# Patient Record
Sex: Male | Born: 1995 | Hispanic: Yes | Marital: Single | Smoking: Never smoker
Health system: Southern US, Community
[De-identification: ages and names within clinical notes are randomized; demographics above are authoritative.]

---
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
## 2021-12-17 ENCOUNTER — Encounter (HOSPITAL_COMMUNITY): Payer: Self-pay | Admitting: Emergency Medicine

## 2021-12-17 ENCOUNTER — Emergency Department (HOSPITAL_COMMUNITY)
Admission: EM | Admit: 2021-12-17 | Discharge: 2021-12-18 | Discharge status: Against Medical Advice | Payer: Self-pay | Attending: Physician Assistant | Admitting: Physician Assistant

## 2021-12-17 ENCOUNTER — Other Ambulatory Visit: Payer: Self-pay

## 2021-12-17 DIAGNOSIS — Z5321 Procedure and treatment not carried out due to patient leaving prior to being seen by health care provider: Secondary | ICD-10-CM | POA: Insufficient documentation

## 2021-12-17 DIAGNOSIS — L509 Urticaria, unspecified: Secondary | ICD-10-CM | POA: Insufficient documentation

## 2021-12-17 MED ORDER — DIPHENHYDRAMINE HCL 25 MG PO CAPS
25.0000 mg | ORAL_CAPSULE | Freq: Once | ORAL | Status: AC
  Administered 2021-12-17: 25 mg via ORAL
  Filled 2021-12-17: qty 1

## 2021-12-17 NOTE — ED Triage Notes (Signed)
Patient reports generalized rashes /hives this evening , airway intact/no oral swelling .

## 2021-12-17 NOTE — ED Provider Triage Note (Signed)
Emergency Medicine Provider Triage Evaluation Note  Kenshawn Maciolek , a 26 y.o. male  was evaluated in triage.  Pt complains of urticaria.  Began just PTA.  No sensation of throat closing, shortness of breath.  No new lotions, perfumes or detergents.  No meds PTA.  Of note patient did recently returned 1 week ago from [place].  He was sick at that time however has been fine since.  Review of Systems  Positive: urticaria Negative: Sob, sensation of throat closing  Physical Exam  BP (!) 151/93 (BP Location: Right Arm)   Pulse 94   Temp 98.4 F (36.9 C) (Oral)   Resp 16   SpO2 100%  Gen:   Awake, no distress   Resp:  Normal effort  MSK:   Moves extremities without difficulty  SKIN:  Urticaria to anterior posterior trunk Other:    Medical Decision Making  Medically screening exam initiated at 9:25 PM.  Appropriate orders placed.  Teyton Pattillo was informed that the remainder of the evaluation will be completed by another provider, this initial triage assessment does not replace that evaluation, and the importance of remaining in the ED until their evaluation is complete.  Urticaria, no evidence of anaphylaxis   Onesimo Lingard A, PA-C 12/17/21 2126

## 2021-12-17 NOTE — ED Notes (Signed)
Pt states that they are leaving due to long wait times.
